# Patient Record
Sex: Male | Born: 1955 | Race: White | Hispanic: No | Smoking: Never smoker
Health system: Southern US, Community
[De-identification: ages and names within clinical notes are randomized; demographics above are authoritative.]

## PROBLEM LIST (undated history)

## (undated) DIAGNOSIS — K219 Gastro-esophageal reflux disease without esophagitis: Secondary | ICD-10-CM

## (undated) DIAGNOSIS — M1990 Unspecified osteoarthritis, unspecified site: Secondary | ICD-10-CM

## (undated) DIAGNOSIS — F419 Anxiety disorder, unspecified: Secondary | ICD-10-CM

## (undated) HISTORY — PX: TONSILLECTOMY: SUR1361

## (undated) HISTORY — DX: Gastro-esophageal reflux disease without esophagitis: K21.9

## (undated) HISTORY — PX: SPINAL FUSION: SHX223

## (undated) HISTORY — DX: Unspecified osteoarthritis, unspecified site: M19.90

## (undated) HISTORY — PX: KNEE SURGERY: SHX244

## (undated) HISTORY — PX: APPENDECTOMY: SHX54

## (undated) HISTORY — PX: OTHER SURGICAL HISTORY: SHX169

## (undated) HISTORY — DX: Anxiety disorder, unspecified: F41.9

---

## 2004-01-17 ENCOUNTER — Ambulatory Visit (HOSPITAL_COMMUNITY)
Admission: RE | Admit: 2004-01-17 | Discharge: 2004-01-17 | Payer: Self-pay | Admitting: Physical Medicine and Rehabilitation

## 2007-01-28 ENCOUNTER — Ambulatory Visit (HOSPITAL_COMMUNITY): Admission: RE | Admit: 2007-01-28 | Discharge: 2007-01-28 | Payer: Self-pay | Admitting: Sports Medicine

## 2007-02-17 ENCOUNTER — Encounter: Admission: RE | Admit: 2007-02-17 | Discharge: 2007-02-17 | Payer: Self-pay | Admitting: Family Medicine

## 2007-05-05 ENCOUNTER — Encounter: Admission: RE | Admit: 2007-05-05 | Discharge: 2007-05-05 | Payer: Self-pay | Admitting: Otolaryngology

## 2009-07-27 ENCOUNTER — Ambulatory Visit (HOSPITAL_COMMUNITY): Admission: RE | Admit: 2009-07-27 | Discharge: 2009-07-28 | Payer: Self-pay | Admitting: Neurosurgery

## 2010-05-15 LAB — CBC
HCT: 42.1 % (ref 39.0–52.0)
HCT: 42.9 % (ref 39.0–52.0)
Hemoglobin: 14.5 g/dL (ref 13.0–17.0)
Hemoglobin: 14.6 g/dL (ref 13.0–17.0)
MCHC: 34.1 g/dL (ref 30.0–36.0)
MCHC: 34.4 g/dL (ref 30.0–36.0)
MCV: 88.7 fL (ref 78.0–100.0)
MCV: 89.4 fL (ref 78.0–100.0)
Platelets: 133 10*3/uL — ABNORMAL LOW (ref 150–400)
Platelets: 148 10*3/uL — ABNORMAL LOW (ref 150–400)
RBC: 4.75 MIL/uL (ref 4.22–5.81)
RBC: 4.8 MIL/uL (ref 4.22–5.81)
RDW: 13.9 % (ref 11.5–15.5)
RDW: 14 % (ref 11.5–15.5)
WBC: 4.8 10*3/uL (ref 4.0–10.5)
WBC: 5.4 10*3/uL (ref 4.0–10.5)

## 2010-05-15 LAB — DIFFERENTIAL
Basophils Absolute: 0 10*3/uL (ref 0.0–0.1)
Basophils Relative: 1 % (ref 0–1)
Eosinophils Absolute: 0.1 10*3/uL (ref 0.0–0.7)
Eosinophils Relative: 2 % (ref 0–5)
Lymphocytes Relative: 36 % (ref 12–46)
Lymphs Abs: 1.9 10*3/uL (ref 0.7–4.0)
Monocytes Absolute: 0.6 10*3/uL (ref 0.1–1.0)
Monocytes Relative: 10 % (ref 3–12)
Neutro Abs: 2.8 10*3/uL (ref 1.7–7.7)
Neutrophils Relative %: 51 % (ref 43–77)

## 2010-05-15 LAB — SURGICAL PCR SCREEN
MRSA, PCR: NEGATIVE
Staphylococcus aureus: POSITIVE — AB

## 2014-02-15 ENCOUNTER — Other Ambulatory Visit: Payer: Self-pay | Admitting: Neurosurgery

## 2014-02-15 DIAGNOSIS — S129XXA Fracture of neck, unspecified, initial encounter: Secondary | ICD-10-CM

## 2014-03-08 ENCOUNTER — Ambulatory Visit
Admission: RE | Admit: 2014-03-08 | Discharge: 2014-03-08 | Disposition: A | Discharge status: Home / Self Care | Payer: BLUE CROSS/BLUE SHIELD | Source: Ambulatory Visit | Attending: Neurosurgery | Admitting: Neurosurgery

## 2014-03-08 DIAGNOSIS — S129XXA Fracture of neck, unspecified, initial encounter: Secondary | ICD-10-CM

## 2016-01-29 IMAGING — CT CT CERVICAL SPINE W/O CM
5 of 7 series · 15 of 33 positions shown, 16 images · non-contrast
Comparison: Plain radiographs 12/23/2013. MRI cervical spine
06/23/2011.

CLINICAL DATA: Neck pain with BILATERAL hand numbness. Previous
fusion.

EXAM:
CT CERVICAL SPINE WITHOUT CONTRAST
TECHNIQUE: Multidetector CT imaging of the cervical spine was performed without
intravenous contrast. Multiplanar CT image reconstructions were also
generated.

[Series 2: c spine bone · axial · 0.23mm/px · z∈[-240,-45]mm · 3 of 79 slices shown, 4 images]
[im 1/79  soft-tissue]
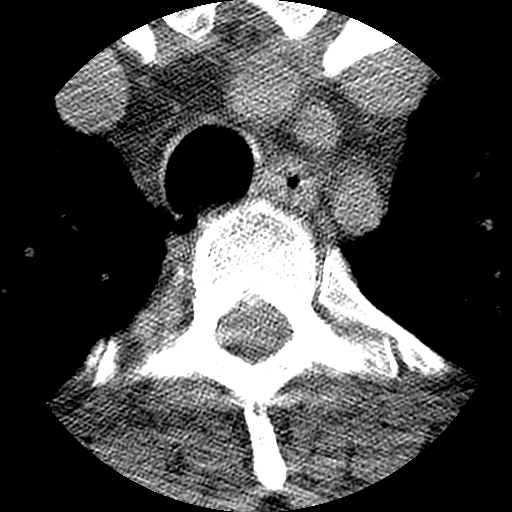
[im 1/79  bone]
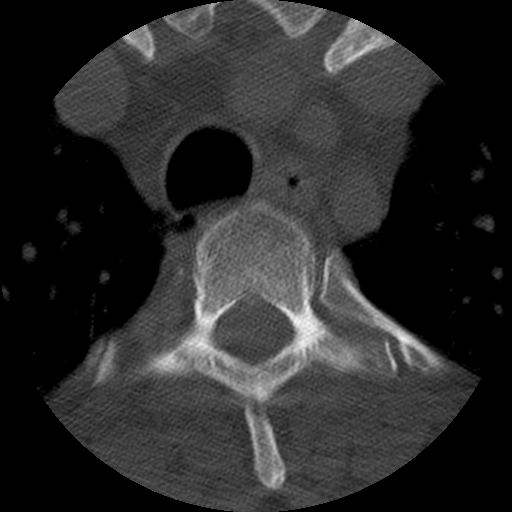
[im 40/79  bone]
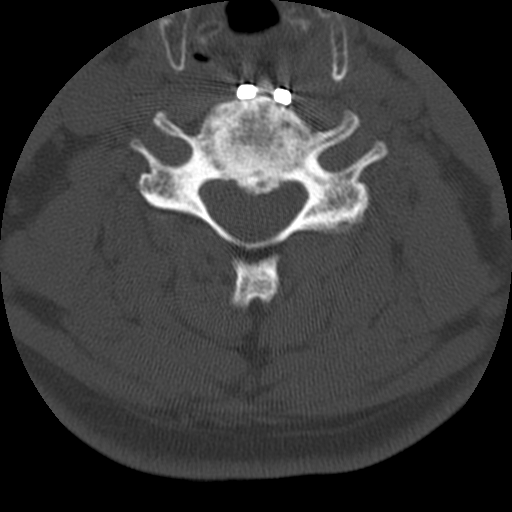
[im 79/79  bone]
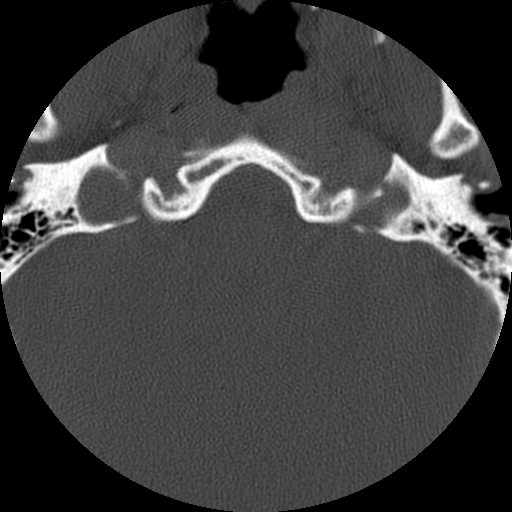

[Series 3: c spine soft · axial · 0.23mm/px · z∈[-175,-110]mm · 2 of 79 slices shown]
[im 27/79  soft-tissue]
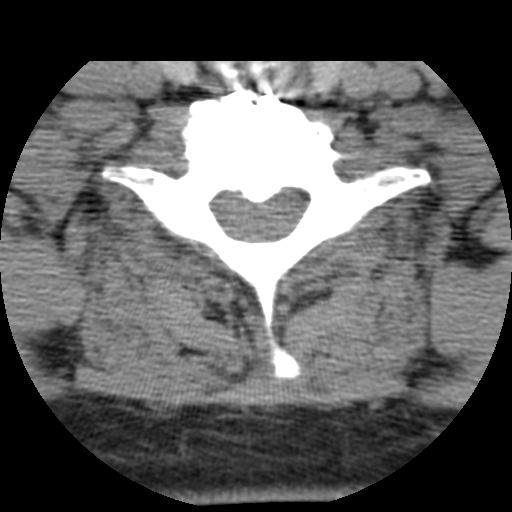
[im 53/79  soft-tissue]
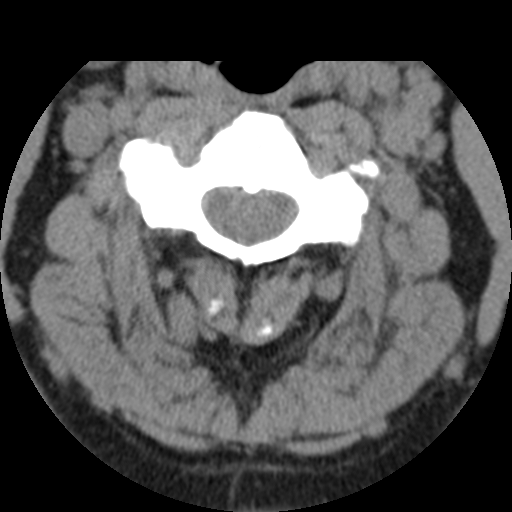

[Series 401: cor lower · coronal · 0.39mm/px · 3 of 46 slices shown]
[im 10/46  bone]
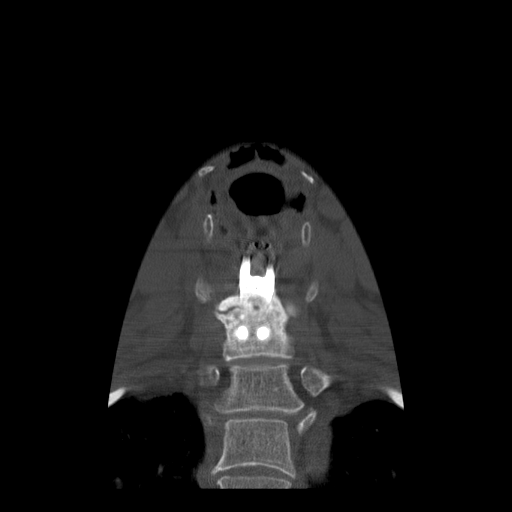
[im 19/46  bone]
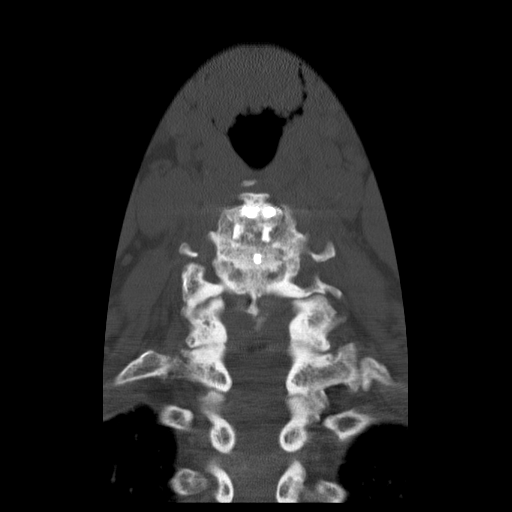
[im 28/46  bone]
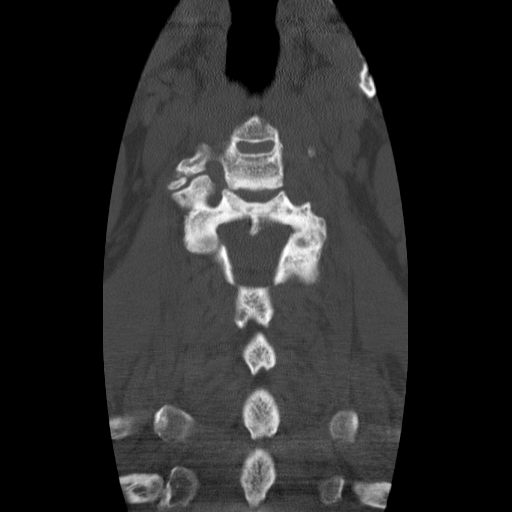

[Series 402: sag · sagittal · 0.39mm/px · 5 of 46 slices shown]
[im 8/46  bone]
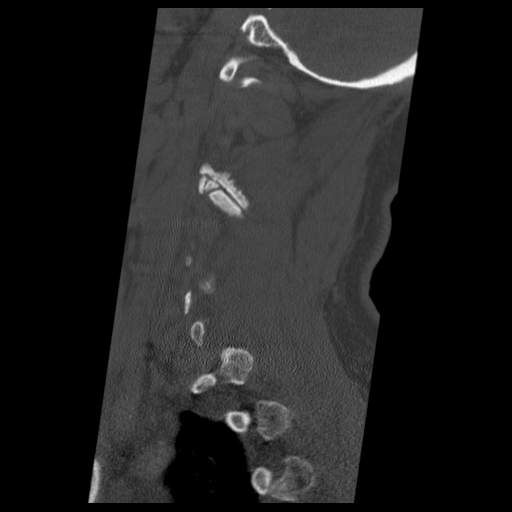
[im 16/46  bone]
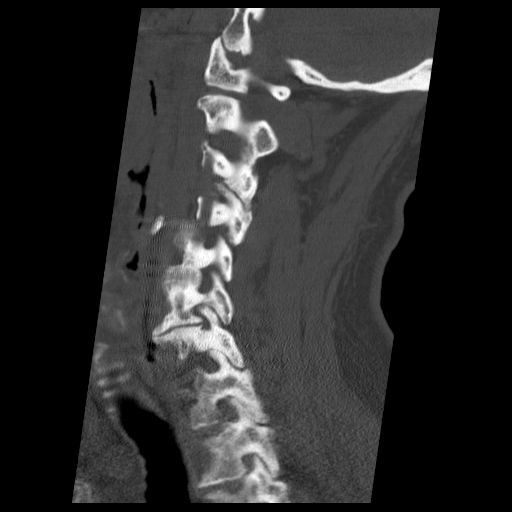
[im 23/46  bone]
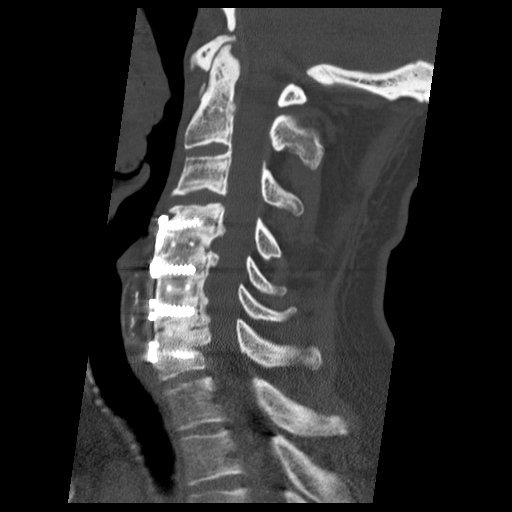
[im 31/46  bone]
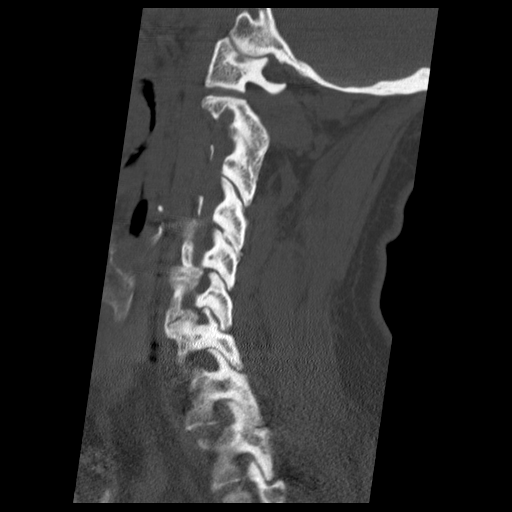
[im 38/46  bone]
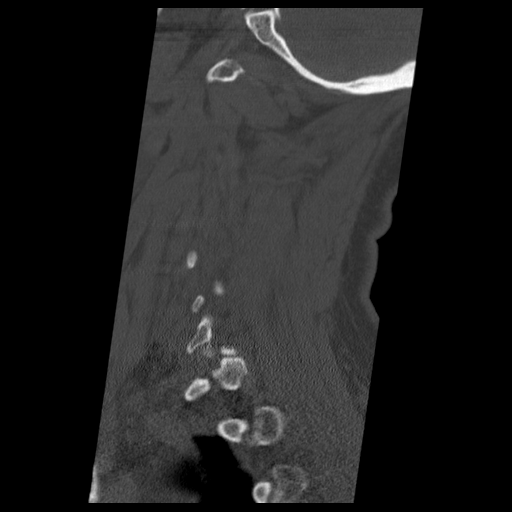

[Series 403: axial upper · axial · 0.23mm/px · z∈[-149,-98]mm · 2 of 78 slices shown]
[im 26/78  bone]
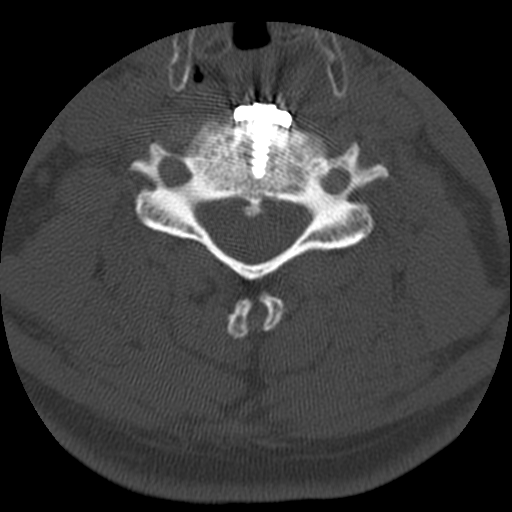
[im 52/78  bone]
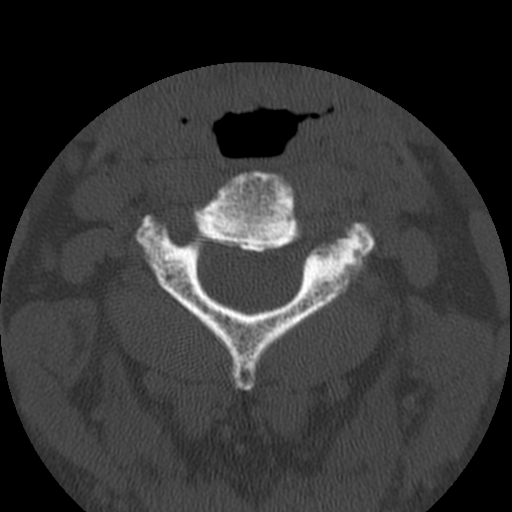

[15 of 33 positions shown; findings below may reference images not displayed]

FINDINGS: Alignment: In is observed:

Vertebrae: Solid C4-5 and C5-6 fusion. There is an irregular lucency
through the interspace at C6-7. There may be a rudimentary osseous
bridge across the interbody cage, which is significantly subsided
into C6 and C7, but for the most part, the appearance is that of a
pseudarthrosis.

Posterior Fossa: No gross abnormality.

Hardware: Dual screws at C4 and C7. Single midline screws at C5 and
C6. Plate and screws appear intact. No appreciable loosening or
malposition.

Paraspinal tissues: Unremarkable.

Disc levels:

The individual disc spaces were examined as follows:

C2-3: There is a solid posterior fusion across the facets. This
appears congenital. No foraminal narrowing.

C3-4: Severe RIGHT and mild LEFT facet arthropathy. RIGHT-sided
uncinate spurring. RIGHT C4 nerve root impingement.

C4-5: Solid fusion. No impingement. Slight OPLL narrows the sagittal
diameter of the spinal canal minimally.

C5-6:  Solid fusion.  No impingement.

C6-7: Pseudarthrosis. Severe RIGHT and moderate LEFT foraminal
narrowing due to uncinate spurring. Mild central OPLL.

C7-T1:  Mild facet arthropathy.  No impingement.
IMPRESSION: Solid C4 through C6 fusion.  Pseudarthrosis C6-7.

Severe RIGHT and moderate LEFT foraminal narrowing at C6-7 due to
uncinate spurring.

Severe RIGHT and mild LEFT facet arthropathy at C3-4 with
right-sided uncinate spurring results in RIGHT C4 nerve root
impingement.

Solid posterior fusion C2-3.

Mild multilevel OPLL.

## 2016-07-24 HISTORY — PX: OTHER SURGICAL HISTORY: SHX169

## 2016-12-28 ENCOUNTER — Ambulatory Visit (INDEPENDENT_AMBULATORY_CARE_PROVIDER_SITE_OTHER): Payer: Managed Care, Other (non HMO) | Admitting: Orthopaedic Surgery

## 2016-12-28 ENCOUNTER — Encounter (INDEPENDENT_AMBULATORY_CARE_PROVIDER_SITE_OTHER): Payer: Self-pay | Admitting: Orthopaedic Surgery

## 2016-12-28 VITALS — BP 145/94 | HR 71 | Ht 72.0 in | Wt 200.0 lb

## 2016-12-28 DIAGNOSIS — Z9889 Other specified postprocedural states: Secondary | ICD-10-CM | POA: Diagnosis not present

## 2016-12-28 NOTE — Progress Notes (Signed)
Office Visit Note   Patient: Douglas Tate           Date of Birth: [birth year]           MRN: [id number] Visit Date: 12/28/2016              Requested by: No referring provider defined for this encounter. PCP: Ocie Doyne., MD   Assessment & Plan: Visit Diagnoses:  1. Status post lumbar discectomy       With continued pain med usage times 6 months.   Plan: I reviewed the MRI scan with the patient as well as findings on his physical exam.  We reviewed the MRI scan before surgery as well as the one after surgery.  At this point I discussed with him that the continued narcotic usage is probably working against him and I recommend he wean this off and continue on a walking program.  When he has been off the pain medication and begins to wear off he states he is having increased pain.  We discussed problems with prolonged narcotic usage and that it can actually lead to having more pain and not helping him.  His postop MRI scan looks good that was done in September.  We discussed narcotic suppression of normal chemicals at the brain makes and how that can lead to increased pain problems.  Patient is still working.  He can return to see me in 1-2 months as needed. Extensive discussion with the patient today concerning his indications for surgery previously, the scans before and after, pathophysiology of lumbar disc degeneration.  Discussion about narcotic usage ongoing and potential problems with that.  I discussed in the that he will need to work as well if the pain medication for he starts feeling better and continue working on a walking program. Follow-Up Instructions: No Follow-up on file.   Orders:  No orders of the defined types were placed in this encounter.  No orders of the defined types were placed in this encounter.     Procedures: No procedures performed   Clinical Data: No additional findings.   Subjective: Chief Complaint  Patient presents with  . Lower Back - Pain     HPI 61 year old male had surgery in [facility] 07/24/2016.  He went back to work at 2 weeks and had pain shooting down his leg.  Pain is in his back and his right buttocks and down into his right foot.  He states he has had some trouble on the left side but primarily on the right.  He had another MRI done at [facility] on 10/30/2016 which is postop imaging.  He is going to be set up for some epidural steroid injections and is seen here for another opinion.  He has been on hydrocodone 10 mg.  Recently started Naprosyn.  He has difficulty putting his socks on.  He denies chills or fever no drainage from his incision.  He has been on the Norco 10 mg every 4-6 hours.  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  Surgery was on the L4-5 level.  He relates increased pain when he bends over problems putting on shoes and socks.  Review of Systems phosphorus of reflux, anxiety, arthritis, previous neck surgery November 2017 knee arthroscopy.  Appendectomy, tonsillectomy, adenoidectomy.  14 point system otherwise negative as it pertains to HPI.  Patient is on Lexapro.  Objective: Vital Signs: BP (!) 145/94   Pulse 71   Ht 6' (1.829 m)   [employer]  200 lb (90.7 kg)   BMI 27.12 kg/m   Physical Exam  Constitutional: He is oriented to person, place, and time. He appears well-developed and well-nourished.  HENT:  Head: Normocephalic and atraumatic.  Eyes: EOM are normal. Pupils are equal, round, and reactive to light.  Neck: No tracheal deviation present. No thyromegaly present.  Cardiovascular: Normal rate.  Pulmonary/Chest: Effort normal. He has no wheezes.  Abdominal: Soft. Bowel sounds are normal.  Neurological: He is alert and oriented to person, place, and time.  Skin: Skin is warm and dry. Capillary refill takes less than 2 seconds.  Psychiatric: He has a normal mood and affect. His behavior is normal. Judgment and thought content normal.    Ortho Exam lumbar incision is well-healed.  He has minimal  sciatic notch tenderness.  Negative straight leg raising at 90 degrees.  He can heel and toe walk.  Some decreased sensation on the dorsum of the right foot.  Negative popliteal compression test.  Normal hip range of motion knees reach full extension good quad strength gastrocsoleus is strong.  Lumbar incisions well-healed there is no puffiness no erythema.  Specialty Comments:  No specialty comments available.  Imaging: Lumbar MRI scan with and without contrast 10/30/2016 shows postoperative right laminotomy for discectomy at L4-5.  Central canal and foramina are open.  Mild narrowing at L3-4.  Mild left subarticular recess narrowing at L2-3 on the left.  No evidence of disc recurrence or significant stenosis.  This was read by Dr.Dalessio   PMFS History: There are no active problems to display for this patient.  Past Medical History:  Diagnosis Date  . Acid reflux   . Anxiety   . Arthritis     Family History  Problem Relation Age of Onset  . Cancer Brother     Past Surgical History:  Procedure Laterality Date  . adenoids removed    . APPENDECTOMY    . KNEE SURGERY    . lumbar discectomy  07/24/2016   open  . SPINAL FUSION    . TONSILLECTOMY     Social History   Occupational History  . Not on file  Tobacco Use  . Smoking status: Never Smoker  . Smokeless tobacco: Never Used  Substance and Sexual Activity  . Alcohol use: Yes    Comment: 2-3 per week  . Drug use: No  . Sexual activity: Not on file

## 2017-01-07 ENCOUNTER — Encounter (INDEPENDENT_AMBULATORY_CARE_PROVIDER_SITE_OTHER): Payer: Self-pay | Admitting: Orthopaedic Surgery

## 2017-01-08 ENCOUNTER — Encounter (INDEPENDENT_AMBULATORY_CARE_PROVIDER_SITE_OTHER): Payer: Self-pay | Admitting: Orthopaedic Surgery

## 2020-10-04 ENCOUNTER — Other Ambulatory Visit: Payer: Self-pay | Admitting: Family Medicine

## 2020-10-04 ENCOUNTER — Other Ambulatory Visit: Payer: Self-pay

## 2020-10-04 ENCOUNTER — Ambulatory Visit: Payer: Self-pay

## 2020-10-04 DIAGNOSIS — M25511 Pain in right shoulder: Secondary | ICD-10-CM

## 2021-03-27 DIAGNOSIS — M545 Low back pain, unspecified: Secondary | ICD-10-CM | POA: Diagnosis not present

## 2021-05-01 DIAGNOSIS — E538 Deficiency of other specified B group vitamins: Secondary | ICD-10-CM | POA: Diagnosis not present

## 2021-05-01 DIAGNOSIS — Z79899 Other long term (current) drug therapy: Secondary | ICD-10-CM | POA: Diagnosis not present

## 2021-05-01 DIAGNOSIS — Z6826 Body mass index (BMI) 26.0-26.9, adult: Secondary | ICD-10-CM | POA: Diagnosis not present

## 2021-05-01 DIAGNOSIS — E785 Hyperlipidemia, unspecified: Secondary | ICD-10-CM | POA: Diagnosis not present

## 2021-05-01 DIAGNOSIS — F419 Anxiety disorder, unspecified: Secondary | ICD-10-CM | POA: Diagnosis not present

## 2021-05-01 DIAGNOSIS — E559 Vitamin D deficiency, unspecified: Secondary | ICD-10-CM | POA: Diagnosis not present

## 2021-05-01 DIAGNOSIS — L989 Disorder of the skin and subcutaneous tissue, unspecified: Secondary | ICD-10-CM | POA: Diagnosis not present

## 2021-05-01 DIAGNOSIS — Z9181 History of falling: Secondary | ICD-10-CM | POA: Diagnosis not present

## 2021-05-01 DIAGNOSIS — M545 Low back pain, unspecified: Secondary | ICD-10-CM | POA: Diagnosis not present

## 2021-05-30 DIAGNOSIS — R220 Localized swelling, mass and lump, head: Secondary | ICD-10-CM | POA: Diagnosis not present

## 2021-05-30 DIAGNOSIS — M545 Low back pain, unspecified: Secondary | ICD-10-CM | POA: Diagnosis not present

## 2021-05-30 DIAGNOSIS — E785 Hyperlipidemia, unspecified: Secondary | ICD-10-CM | POA: Diagnosis not present

## 2021-05-30 DIAGNOSIS — Z6826 Body mass index (BMI) 26.0-26.9, adult: Secondary | ICD-10-CM | POA: Diagnosis not present

## 2021-06-05 DIAGNOSIS — H538 Other visual disturbances: Secondary | ICD-10-CM | POA: Diagnosis not present

## 2021-06-05 DIAGNOSIS — J329 Chronic sinusitis, unspecified: Secondary | ICD-10-CM | POA: Diagnosis not present

## 2021-06-05 DIAGNOSIS — J341 Cyst and mucocele of nose and nasal sinus: Secondary | ICD-10-CM | POA: Diagnosis not present

## 2021-06-05 DIAGNOSIS — R220 Localized swelling, mass and lump, head: Secondary | ICD-10-CM | POA: Diagnosis not present

## 2021-06-29 DIAGNOSIS — Z6827 Body mass index (BMI) 27.0-27.9, adult: Secondary | ICD-10-CM | POA: Diagnosis not present

## 2021-06-29 DIAGNOSIS — Z23 Encounter for immunization: Secondary | ICD-10-CM | POA: Diagnosis not present

## 2021-06-29 DIAGNOSIS — M545 Low back pain, unspecified: Secondary | ICD-10-CM | POA: Diagnosis not present

## 2021-07-27 DIAGNOSIS — Z6827 Body mass index (BMI) 27.0-27.9, adult: Secondary | ICD-10-CM | POA: Diagnosis not present

## 2021-07-27 DIAGNOSIS — M545 Low back pain, unspecified: Secondary | ICD-10-CM | POA: Diagnosis not present

## 2021-08-15 DIAGNOSIS — J342 Deviated nasal septum: Secondary | ICD-10-CM | POA: Diagnosis not present

## 2021-08-15 DIAGNOSIS — J322 Chronic ethmoidal sinusitis: Secondary | ICD-10-CM | POA: Diagnosis not present

## 2021-08-15 DIAGNOSIS — D485 Neoplasm of uncertain behavior of skin: Secondary | ICD-10-CM | POA: Diagnosis not present

## 2021-08-15 DIAGNOSIS — Z87828 Personal history of other (healed) physical injury and trauma: Secondary | ICD-10-CM | POA: Diagnosis not present

## 2021-08-15 DIAGNOSIS — R519 Headache, unspecified: Secondary | ICD-10-CM | POA: Diagnosis not present

## 2021-08-15 DIAGNOSIS — J320 Chronic maxillary sinusitis: Secondary | ICD-10-CM | POA: Diagnosis not present

## 2021-08-15 DIAGNOSIS — J341 Cyst and mucocele of nose and nasal sinus: Secondary | ICD-10-CM | POA: Diagnosis not present

## 2021-08-17 DIAGNOSIS — M25611 Stiffness of right shoulder, not elsewhere classified: Secondary | ICD-10-CM | POA: Diagnosis not present

## 2021-08-17 DIAGNOSIS — M6281 Muscle weakness (generalized): Secondary | ICD-10-CM | POA: Diagnosis not present

## 2021-08-17 DIAGNOSIS — M25511 Pain in right shoulder: Secondary | ICD-10-CM | POA: Diagnosis not present

## 2021-08-17 DIAGNOSIS — R293 Abnormal posture: Secondary | ICD-10-CM | POA: Diagnosis not present

## 2021-08-24 DIAGNOSIS — Z79899 Other long term (current) drug therapy: Secondary | ICD-10-CM | POA: Diagnosis not present

## 2021-08-24 DIAGNOSIS — M545 Low back pain, unspecified: Secondary | ICD-10-CM | POA: Diagnosis not present

## 2021-08-24 DIAGNOSIS — Z6827 Body mass index (BMI) 27.0-27.9, adult: Secondary | ICD-10-CM | POA: Diagnosis not present

## 2021-09-25 DIAGNOSIS — Z6826 Body mass index (BMI) 26.0-26.9, adult: Secondary | ICD-10-CM | POA: Diagnosis not present

## 2021-09-25 DIAGNOSIS — M545 Low back pain, unspecified: Secondary | ICD-10-CM | POA: Diagnosis not present

## 2021-09-25 DIAGNOSIS — F419 Anxiety disorder, unspecified: Secondary | ICD-10-CM | POA: Diagnosis not present

## 2021-09-25 DIAGNOSIS — Z125 Encounter for screening for malignant neoplasm of prostate: Secondary | ICD-10-CM | POA: Diagnosis not present

## 2021-09-25 DIAGNOSIS — E559 Vitamin D deficiency, unspecified: Secondary | ICD-10-CM | POA: Diagnosis not present

## 2021-09-25 DIAGNOSIS — E785 Hyperlipidemia, unspecified: Secondary | ICD-10-CM | POA: Diagnosis not present

## 2021-09-25 DIAGNOSIS — E538 Deficiency of other specified B group vitamins: Secondary | ICD-10-CM | POA: Diagnosis not present

## 2021-10-02 DIAGNOSIS — L821 Other seborrheic keratosis: Secondary | ICD-10-CM | POA: Diagnosis not present

## 2021-10-02 DIAGNOSIS — D2262 Melanocytic nevi of left upper limb, including shoulder: Secondary | ICD-10-CM | POA: Diagnosis not present

## 2021-10-02 DIAGNOSIS — L814 Other melanin hyperpigmentation: Secondary | ICD-10-CM | POA: Diagnosis not present

## 2021-10-02 DIAGNOSIS — L570 Actinic keratosis: Secondary | ICD-10-CM | POA: Diagnosis not present

## 2021-10-02 DIAGNOSIS — L738 Other specified follicular disorders: Secondary | ICD-10-CM | POA: Diagnosis not present

## 2021-10-02 DIAGNOSIS — D2272 Melanocytic nevi of left lower limb, including hip: Secondary | ICD-10-CM | POA: Diagnosis not present

## 2021-10-02 DIAGNOSIS — D225 Melanocytic nevi of trunk: Secondary | ICD-10-CM | POA: Diagnosis not present

## 2021-12-06 DIAGNOSIS — Z23 Encounter for immunization: Secondary | ICD-10-CM | POA: Diagnosis not present

## 2021-12-06 DIAGNOSIS — Z6826 Body mass index (BMI) 26.0-26.9, adult: Secondary | ICD-10-CM | POA: Diagnosis not present

## 2021-12-06 DIAGNOSIS — Z79899 Other long term (current) drug therapy: Secondary | ICD-10-CM | POA: Diagnosis not present

## 2021-12-06 DIAGNOSIS — M545 Low back pain, unspecified: Secondary | ICD-10-CM | POA: Diagnosis not present

## 2021-12-06 DIAGNOSIS — M25571 Pain in right ankle and joints of right foot: Secondary | ICD-10-CM | POA: Diagnosis not present

## 2022-01-01 DIAGNOSIS — M71371 Other bursal cyst, right ankle and foot: Secondary | ICD-10-CM | POA: Diagnosis not present

## 2022-01-01 DIAGNOSIS — M25571 Pain in right ankle and joints of right foot: Secondary | ICD-10-CM | POA: Diagnosis not present

## 2022-01-04 DIAGNOSIS — E785 Hyperlipidemia, unspecified: Secondary | ICD-10-CM | POA: Diagnosis not present

## 2022-01-04 DIAGNOSIS — Z6826 Body mass index (BMI) 26.0-26.9, adult: Secondary | ICD-10-CM | POA: Diagnosis not present

## 2022-01-04 DIAGNOSIS — F419 Anxiety disorder, unspecified: Secondary | ICD-10-CM | POA: Diagnosis not present

## 2022-01-04 DIAGNOSIS — M545 Low back pain, unspecified: Secondary | ICD-10-CM | POA: Diagnosis not present

## 2022-01-04 DIAGNOSIS — E538 Deficiency of other specified B group vitamins: Secondary | ICD-10-CM | POA: Diagnosis not present

## 2022-01-04 DIAGNOSIS — Z139 Encounter for screening, unspecified: Secondary | ICD-10-CM | POA: Diagnosis not present

## 2022-01-29 DIAGNOSIS — Z8546 Personal history of malignant neoplasm of prostate: Secondary | ICD-10-CM | POA: Diagnosis not present

## 2022-01-29 DIAGNOSIS — C61 Malignant neoplasm of prostate: Secondary | ICD-10-CM | POA: Diagnosis not present

## 2022-01-29 DIAGNOSIS — N5231 Erectile dysfunction following radical prostatectomy: Secondary | ICD-10-CM | POA: Diagnosis not present

## 2022-01-29 DIAGNOSIS — R301 Vesical tenesmus: Secondary | ICD-10-CM | POA: Diagnosis not present

## 2022-02-01 DIAGNOSIS — M545 Low back pain, unspecified: Secondary | ICD-10-CM | POA: Diagnosis not present

## 2022-02-01 DIAGNOSIS — Z6826 Body mass index (BMI) 26.0-26.9, adult: Secondary | ICD-10-CM | POA: Diagnosis not present

## 2022-03-01 DIAGNOSIS — M545 Low back pain, unspecified: Secondary | ICD-10-CM | POA: Diagnosis not present

## 2022-03-01 DIAGNOSIS — Z79899 Other long term (current) drug therapy: Secondary | ICD-10-CM | POA: Diagnosis not present

## 2022-03-01 DIAGNOSIS — Z6826 Body mass index (BMI) 26.0-26.9, adult: Secondary | ICD-10-CM | POA: Diagnosis not present

## 2022-03-15 DIAGNOSIS — F419 Anxiety disorder, unspecified: Secondary | ICD-10-CM | POA: Diagnosis not present

## 2022-03-15 DIAGNOSIS — G8929 Other chronic pain: Secondary | ICD-10-CM | POA: Diagnosis not present

## 2022-03-29 DIAGNOSIS — Z6827 Body mass index (BMI) 27.0-27.9, adult: Secondary | ICD-10-CM | POA: Diagnosis not present

## 2022-03-29 DIAGNOSIS — M545 Low back pain, unspecified: Secondary | ICD-10-CM | POA: Diagnosis not present

## 2022-04-30 DIAGNOSIS — M545 Low back pain, unspecified: Secondary | ICD-10-CM | POA: Diagnosis not present

## 2022-04-30 DIAGNOSIS — J302 Other seasonal allergic rhinitis: Secondary | ICD-10-CM | POA: Diagnosis not present

## 2022-04-30 DIAGNOSIS — Z6826 Body mass index (BMI) 26.0-26.9, adult: Secondary | ICD-10-CM | POA: Diagnosis not present

## 2022-04-30 DIAGNOSIS — E559 Vitamin D deficiency, unspecified: Secondary | ICD-10-CM | POA: Diagnosis not present

## 2022-04-30 DIAGNOSIS — E538 Deficiency of other specified B group vitamins: Secondary | ICD-10-CM | POA: Diagnosis not present

## 2022-04-30 DIAGNOSIS — E785 Hyperlipidemia, unspecified: Secondary | ICD-10-CM | POA: Diagnosis not present

## 2022-05-31 DIAGNOSIS — M545 Low back pain, unspecified: Secondary | ICD-10-CM | POA: Diagnosis not present

## 2022-05-31 DIAGNOSIS — Z6826 Body mass index (BMI) 26.0-26.9, adult: Secondary | ICD-10-CM | POA: Diagnosis not present

## 2022-05-31 DIAGNOSIS — Z79899 Other long term (current) drug therapy: Secondary | ICD-10-CM | POA: Diagnosis not present

## 2022-05-31 DIAGNOSIS — Z1331 Encounter for screening for depression: Secondary | ICD-10-CM | POA: Diagnosis not present

## 2022-05-31 DIAGNOSIS — Z9181 History of falling: Secondary | ICD-10-CM | POA: Diagnosis not present

## 2022-06-28 DIAGNOSIS — Z6825 Body mass index (BMI) 25.0-25.9, adult: Secondary | ICD-10-CM | POA: Diagnosis not present

## 2022-06-28 DIAGNOSIS — M545 Low back pain, unspecified: Secondary | ICD-10-CM | POA: Diagnosis not present

## 2022-07-30 DIAGNOSIS — E538 Deficiency of other specified B group vitamins: Secondary | ICD-10-CM | POA: Diagnosis not present

## 2022-07-30 DIAGNOSIS — Z8546 Personal history of malignant neoplasm of prostate: Secondary | ICD-10-CM | POA: Diagnosis not present

## 2022-07-30 DIAGNOSIS — Z6825 Body mass index (BMI) 25.0-25.9, adult: Secondary | ICD-10-CM | POA: Diagnosis not present

## 2022-07-30 DIAGNOSIS — M545 Low back pain, unspecified: Secondary | ICD-10-CM | POA: Diagnosis not present

## 2022-07-30 DIAGNOSIS — E785 Hyperlipidemia, unspecified: Secondary | ICD-10-CM | POA: Diagnosis not present

## 2022-07-30 DIAGNOSIS — M5431 Sciatica, right side: Secondary | ICD-10-CM | POA: Diagnosis not present

## 2022-07-30 DIAGNOSIS — E559 Vitamin D deficiency, unspecified: Secondary | ICD-10-CM | POA: Diagnosis not present

## 2022-08-29 DIAGNOSIS — Z79899 Other long term (current) drug therapy: Secondary | ICD-10-CM | POA: Diagnosis not present

## 2022-08-29 DIAGNOSIS — Z6825 Body mass index (BMI) 25.0-25.9, adult: Secondary | ICD-10-CM | POA: Diagnosis not present

## 2022-08-29 DIAGNOSIS — M545 Low back pain, unspecified: Secondary | ICD-10-CM | POA: Diagnosis not present

## 2022-09-27 DIAGNOSIS — M545 Low back pain, unspecified: Secondary | ICD-10-CM | POA: Diagnosis not present

## 2022-10-30 DIAGNOSIS — E785 Hyperlipidemia, unspecified: Secondary | ICD-10-CM | POA: Diagnosis not present

## 2022-10-30 DIAGNOSIS — Z6826 Body mass index (BMI) 26.0-26.9, adult: Secondary | ICD-10-CM | POA: Diagnosis not present

## 2022-10-30 DIAGNOSIS — E559 Vitamin D deficiency, unspecified: Secondary | ICD-10-CM | POA: Diagnosis not present

## 2022-10-30 DIAGNOSIS — E538 Deficiency of other specified B group vitamins: Secondary | ICD-10-CM | POA: Diagnosis not present

## 2022-10-30 DIAGNOSIS — F419 Anxiety disorder, unspecified: Secondary | ICD-10-CM | POA: Diagnosis not present

## 2022-10-30 DIAGNOSIS — K219 Gastro-esophageal reflux disease without esophagitis: Secondary | ICD-10-CM | POA: Diagnosis not present

## 2022-10-30 DIAGNOSIS — M545 Low back pain, unspecified: Secondary | ICD-10-CM | POA: Diagnosis not present

## 2022-11-29 DIAGNOSIS — E785 Hyperlipidemia, unspecified: Secondary | ICD-10-CM | POA: Diagnosis not present

## 2022-11-29 DIAGNOSIS — Z79899 Other long term (current) drug therapy: Secondary | ICD-10-CM | POA: Diagnosis not present

## 2022-11-29 DIAGNOSIS — M545 Low back pain, unspecified: Secondary | ICD-10-CM | POA: Diagnosis not present

## 2022-11-29 DIAGNOSIS — Z6826 Body mass index (BMI) 26.0-26.9, adult: Secondary | ICD-10-CM | POA: Diagnosis not present

## 2022-12-03 DIAGNOSIS — Z9181 History of falling: Secondary | ICD-10-CM | POA: Diagnosis not present

## 2022-12-03 DIAGNOSIS — Z139 Encounter for screening, unspecified: Secondary | ICD-10-CM | POA: Diagnosis not present

## 2022-12-03 DIAGNOSIS — Z0000 Encounter for general adult medical examination without abnormal findings: Secondary | ICD-10-CM | POA: Diagnosis not present

## 2022-12-27 DIAGNOSIS — M545 Low back pain, unspecified: Secondary | ICD-10-CM | POA: Diagnosis not present

## 2022-12-27 DIAGNOSIS — Z6826 Body mass index (BMI) 26.0-26.9, adult: Secondary | ICD-10-CM | POA: Diagnosis not present

## 2023-01-28 DIAGNOSIS — M545 Low back pain, unspecified: Secondary | ICD-10-CM | POA: Diagnosis not present

## 2023-01-31 DIAGNOSIS — C61 Malignant neoplasm of prostate: Secondary | ICD-10-CM | POA: Diagnosis not present

## 2023-01-31 DIAGNOSIS — N5231 Erectile dysfunction following radical prostatectomy: Secondary | ICD-10-CM | POA: Diagnosis not present

## 2023-01-31 DIAGNOSIS — R3915 Urgency of urination: Secondary | ICD-10-CM | POA: Diagnosis not present

## 2023-02-28 DIAGNOSIS — E785 Hyperlipidemia, unspecified: Secondary | ICD-10-CM | POA: Diagnosis not present

## 2023-02-28 DIAGNOSIS — K219 Gastro-esophageal reflux disease without esophagitis: Secondary | ICD-10-CM | POA: Diagnosis not present

## 2023-02-28 DIAGNOSIS — F419 Anxiety disorder, unspecified: Secondary | ICD-10-CM | POA: Diagnosis not present

## 2023-02-28 DIAGNOSIS — E538 Deficiency of other specified B group vitamins: Secondary | ICD-10-CM | POA: Diagnosis not present

## 2023-02-28 DIAGNOSIS — Z6826 Body mass index (BMI) 26.0-26.9, adult: Secondary | ICD-10-CM | POA: Diagnosis not present

## 2023-02-28 DIAGNOSIS — M545 Low back pain, unspecified: Secondary | ICD-10-CM | POA: Diagnosis not present

## 2023-02-28 DIAGNOSIS — Z79899 Other long term (current) drug therapy: Secondary | ICD-10-CM | POA: Diagnosis not present

## 2023-04-01 DIAGNOSIS — E785 Hyperlipidemia, unspecified: Secondary | ICD-10-CM | POA: Diagnosis not present

## 2023-04-01 DIAGNOSIS — M545 Low back pain, unspecified: Secondary | ICD-10-CM | POA: Diagnosis not present

## 2023-04-01 DIAGNOSIS — Z6827 Body mass index (BMI) 27.0-27.9, adult: Secondary | ICD-10-CM | POA: Diagnosis not present

## 2023-04-29 DIAGNOSIS — H6691 Otitis media, unspecified, right ear: Secondary | ICD-10-CM | POA: Diagnosis not present

## 2023-04-29 DIAGNOSIS — M545 Low back pain, unspecified: Secondary | ICD-10-CM | POA: Diagnosis not present

## 2023-04-29 DIAGNOSIS — Z6827 Body mass index (BMI) 27.0-27.9, adult: Secondary | ICD-10-CM | POA: Diagnosis not present

## 2023-05-24 DIAGNOSIS — Z6827 Body mass index (BMI) 27.0-27.9, adult: Secondary | ICD-10-CM | POA: Diagnosis not present

## 2023-05-24 DIAGNOSIS — M5416 Radiculopathy, lumbar region: Secondary | ICD-10-CM | POA: Diagnosis not present

## 2023-05-30 DIAGNOSIS — F419 Anxiety disorder, unspecified: Secondary | ICD-10-CM | POA: Diagnosis not present

## 2023-05-30 DIAGNOSIS — E559 Vitamin D deficiency, unspecified: Secondary | ICD-10-CM | POA: Diagnosis not present

## 2023-05-30 DIAGNOSIS — F32A Depression, unspecified: Secondary | ICD-10-CM | POA: Diagnosis not present

## 2023-05-30 DIAGNOSIS — E785 Hyperlipidemia, unspecified: Secondary | ICD-10-CM | POA: Diagnosis not present

## 2023-05-30 DIAGNOSIS — K219 Gastro-esophageal reflux disease without esophagitis: Secondary | ICD-10-CM | POA: Diagnosis not present

## 2023-05-30 DIAGNOSIS — Z6827 Body mass index (BMI) 27.0-27.9, adult: Secondary | ICD-10-CM | POA: Diagnosis not present

## 2023-05-30 DIAGNOSIS — M545 Low back pain, unspecified: Secondary | ICD-10-CM | POA: Diagnosis not present

## 2023-05-30 DIAGNOSIS — E538 Deficiency of other specified B group vitamins: Secondary | ICD-10-CM | POA: Diagnosis not present

## 2023-05-30 DIAGNOSIS — Z79899 Other long term (current) drug therapy: Secondary | ICD-10-CM | POA: Diagnosis not present

## 2023-06-12 DIAGNOSIS — M5440 Lumbago with sciatica, unspecified side: Secondary | ICD-10-CM | POA: Diagnosis not present

## 2023-06-19 DIAGNOSIS — M5440 Lumbago with sciatica, unspecified side: Secondary | ICD-10-CM | POA: Diagnosis not present

## 2023-06-20 DIAGNOSIS — M5416 Radiculopathy, lumbar region: Secondary | ICD-10-CM | POA: Diagnosis not present

## 2023-06-26 DIAGNOSIS — M5440 Lumbago with sciatica, unspecified side: Secondary | ICD-10-CM | POA: Diagnosis not present

## 2023-06-27 DIAGNOSIS — Z6827 Body mass index (BMI) 27.0-27.9, adult: Secondary | ICD-10-CM | POA: Diagnosis not present

## 2023-06-27 DIAGNOSIS — M545 Low back pain, unspecified: Secondary | ICD-10-CM | POA: Diagnosis not present

## 2023-07-03 DIAGNOSIS — M5440 Lumbago with sciatica, unspecified side: Secondary | ICD-10-CM | POA: Diagnosis not present

## 2023-07-10 DIAGNOSIS — M5440 Lumbago with sciatica, unspecified side: Secondary | ICD-10-CM | POA: Diagnosis not present

## 2023-07-17 DIAGNOSIS — M5440 Lumbago with sciatica, unspecified side: Secondary | ICD-10-CM | POA: Diagnosis not present

## 2023-07-29 DIAGNOSIS — M5416 Radiculopathy, lumbar region: Secondary | ICD-10-CM | POA: Diagnosis not present

## 2023-07-29 DIAGNOSIS — Z6827 Body mass index (BMI) 27.0-27.9, adult: Secondary | ICD-10-CM | POA: Diagnosis not present

## 2023-07-29 DIAGNOSIS — M545 Low back pain, unspecified: Secondary | ICD-10-CM | POA: Diagnosis not present

## 2023-08-08 DIAGNOSIS — M5416 Radiculopathy, lumbar region: Secondary | ICD-10-CM | POA: Diagnosis not present

## 2023-08-26 DIAGNOSIS — Z6827 Body mass index (BMI) 27.0-27.9, adult: Secondary | ICD-10-CM | POA: Diagnosis not present

## 2023-08-26 DIAGNOSIS — M545 Low back pain, unspecified: Secondary | ICD-10-CM | POA: Diagnosis not present

## 2023-09-06 DIAGNOSIS — M5416 Radiculopathy, lumbar region: Secondary | ICD-10-CM | POA: Diagnosis not present

## 2023-09-06 DIAGNOSIS — M542 Cervicalgia: Secondary | ICD-10-CM | POA: Diagnosis not present

## 2023-09-06 DIAGNOSIS — Z6827 Body mass index (BMI) 27.0-27.9, adult: Secondary | ICD-10-CM | POA: Diagnosis not present

## 2023-09-25 DIAGNOSIS — M545 Low back pain, unspecified: Secondary | ICD-10-CM | POA: Diagnosis not present

## 2023-09-25 DIAGNOSIS — E785 Hyperlipidemia, unspecified: Secondary | ICD-10-CM | POA: Diagnosis not present

## 2023-09-25 DIAGNOSIS — M5137 Other intervertebral disc degeneration, lumbosacral region with discogenic back pain only: Secondary | ICD-10-CM | POA: Diagnosis not present

## 2023-09-25 DIAGNOSIS — F32A Depression, unspecified: Secondary | ICD-10-CM | POA: Diagnosis not present

## 2023-09-25 DIAGNOSIS — M2548 Effusion, other site: Secondary | ICD-10-CM | POA: Diagnosis not present

## 2023-09-25 DIAGNOSIS — Z79899 Other long term (current) drug therapy: Secondary | ICD-10-CM | POA: Diagnosis not present

## 2023-09-25 DIAGNOSIS — K219 Gastro-esophageal reflux disease without esophagitis: Secondary | ICD-10-CM | POA: Diagnosis not present

## 2023-09-25 DIAGNOSIS — R29898 Other symptoms and signs involving the musculoskeletal system: Secondary | ICD-10-CM | POA: Diagnosis not present

## 2023-09-25 DIAGNOSIS — M6208 Separation of muscle (nontraumatic), other site: Secondary | ICD-10-CM | POA: Diagnosis not present

## 2023-09-25 DIAGNOSIS — M5136 Other intervertebral disc degeneration, lumbar region with discogenic back pain only: Secondary | ICD-10-CM | POA: Diagnosis not present

## 2023-09-25 DIAGNOSIS — J069 Acute upper respiratory infection, unspecified: Secondary | ICD-10-CM | POA: Diagnosis not present

## 2023-09-25 DIAGNOSIS — M5416 Radiculopathy, lumbar region: Secondary | ICD-10-CM | POA: Diagnosis not present

## 2023-09-25 DIAGNOSIS — F419 Anxiety disorder, unspecified: Secondary | ICD-10-CM | POA: Diagnosis not present

## 2023-10-16 DIAGNOSIS — M4726 Other spondylosis with radiculopathy, lumbar region: Secondary | ICD-10-CM | POA: Diagnosis not present

## 2023-10-16 DIAGNOSIS — M961 Postlaminectomy syndrome, not elsewhere classified: Secondary | ICD-10-CM | POA: Diagnosis not present

## 2023-10-16 DIAGNOSIS — M5416 Radiculopathy, lumbar region: Secondary | ICD-10-CM | POA: Diagnosis not present

## 2023-10-16 DIAGNOSIS — Z6827 Body mass index (BMI) 27.0-27.9, adult: Secondary | ICD-10-CM | POA: Diagnosis not present

## 2023-10-24 DIAGNOSIS — M545 Low back pain, unspecified: Secondary | ICD-10-CM | POA: Diagnosis not present

## 2023-10-24 DIAGNOSIS — Z6827 Body mass index (BMI) 27.0-27.9, adult: Secondary | ICD-10-CM | POA: Diagnosis not present

## 2023-11-11 DIAGNOSIS — E781 Pure hyperglyceridemia: Secondary | ICD-10-CM | POA: Diagnosis not present

## 2023-11-14 DIAGNOSIS — M47817 Spondylosis without myelopathy or radiculopathy, lumbosacral region: Secondary | ICD-10-CM | POA: Diagnosis not present

## 2023-11-25 DIAGNOSIS — M545 Low back pain, unspecified: Secondary | ICD-10-CM | POA: Diagnosis not present

## 2023-11-25 DIAGNOSIS — Z6826 Body mass index (BMI) 26.0-26.9, adult: Secondary | ICD-10-CM | POA: Diagnosis not present

## 2023-12-03 DIAGNOSIS — M47816 Spondylosis without myelopathy or radiculopathy, lumbar region: Secondary | ICD-10-CM | POA: Diagnosis not present

## 2023-12-09 DIAGNOSIS — Z9181 History of falling: Secondary | ICD-10-CM | POA: Diagnosis not present

## 2023-12-09 DIAGNOSIS — Z0000 Encounter for general adult medical examination without abnormal findings: Secondary | ICD-10-CM | POA: Diagnosis not present

## 2023-12-24 DIAGNOSIS — M545 Low back pain, unspecified: Secondary | ICD-10-CM | POA: Diagnosis not present

## 2023-12-24 DIAGNOSIS — Z6827 Body mass index (BMI) 27.0-27.9, adult: Secondary | ICD-10-CM | POA: Diagnosis not present

## 2024-01-14 DIAGNOSIS — M47816 Spondylosis without myelopathy or radiculopathy, lumbar region: Secondary | ICD-10-CM | POA: Diagnosis not present

## 2024-01-21 DIAGNOSIS — Z79899 Other long term (current) drug therapy: Secondary | ICD-10-CM | POA: Diagnosis not present

## 2024-01-21 DIAGNOSIS — Z6826 Body mass index (BMI) 26.0-26.9, adult: Secondary | ICD-10-CM | POA: Diagnosis not present

## 2024-01-21 DIAGNOSIS — M545 Low back pain, unspecified: Secondary | ICD-10-CM | POA: Diagnosis not present

## 2024-02-03 DIAGNOSIS — N5231 Erectile dysfunction following radical prostatectomy: Secondary | ICD-10-CM | POA: Diagnosis not present

## 2024-02-03 DIAGNOSIS — N5082 Scrotal pain: Secondary | ICD-10-CM | POA: Diagnosis not present

## 2024-02-03 DIAGNOSIS — C61 Malignant neoplasm of prostate: Secondary | ICD-10-CM | POA: Diagnosis not present

## 2024-02-03 DIAGNOSIS — R3915 Urgency of urination: Secondary | ICD-10-CM | POA: Diagnosis not present
